# Patient Record
Sex: Female | Born: 1970 | Race: White | Hispanic: No | Marital: Single | Smoking: Current every day smoker
Health system: Southern US, Community
[De-identification: ages and names within clinical notes are randomized; demographics above are authoritative.]

## PROBLEM LIST (undated history)

## (undated) DIAGNOSIS — F419 Anxiety disorder, unspecified: Secondary | ICD-10-CM

## (undated) DIAGNOSIS — R51 Headache: Secondary | ICD-10-CM

## (undated) DIAGNOSIS — F410 Panic disorder [episodic paroxysmal anxiety] without agoraphobia: Secondary | ICD-10-CM

## (undated) DIAGNOSIS — N390 Urinary tract infection, site not specified: Secondary | ICD-10-CM

## (undated) DIAGNOSIS — N760 Acute vaginitis: Secondary | ICD-10-CM

## (undated) DIAGNOSIS — B9689 Other specified bacterial agents as the cause of diseases classified elsewhere: Secondary | ICD-10-CM

## (undated) DIAGNOSIS — A599 Trichomoniasis, unspecified: Secondary | ICD-10-CM

## (undated) DIAGNOSIS — F909 Attention-deficit hyperactivity disorder, unspecified type: Secondary | ICD-10-CM

## (undated) HISTORY — PX: BREAST ENHANCEMENT SURGERY: SHX7

## (undated) HISTORY — PX: ANKLE SURGERY: SHX546

## (undated) HISTORY — PX: TUBAL LIGATION: SHX77

---

## 2000-03-21 ENCOUNTER — Emergency Department (HOSPITAL_COMMUNITY): Admission: EM | Admit: 2000-03-21 | Discharge: 2000-03-21 | Payer: Self-pay | Admitting: Emergency Medicine

## 2000-03-25 ENCOUNTER — Emergency Department (HOSPITAL_COMMUNITY): Admission: EM | Admit: 2000-03-25 | Discharge: 2000-03-25 | Payer: Self-pay | Admitting: *Deleted

## 2000-03-26 ENCOUNTER — Encounter: Payer: Self-pay | Admitting: Emergency Medicine

## 2000-03-26 ENCOUNTER — Emergency Department (HOSPITAL_COMMUNITY): Admission: EM | Admit: 2000-03-26 | Discharge: 2000-03-26 | Payer: Self-pay | Admitting: Emergency Medicine

## 2004-03-09 ENCOUNTER — Inpatient Hospital Stay (HOSPITAL_COMMUNITY): Admission: EM | Admit: 2004-03-09 | Discharge: 2004-03-11 | Payer: Self-pay | Admitting: Emergency Medicine

## 2005-11-24 IMAGING — US US EXTREM LOW VENOUS*L*
1 series · 4 of 4 positions shown · non-contrast
Comparison: none

CLINICAL DATA: Left upper extremity cellulitis.  Question DVT.
 US VENOUS IMAGING, LEFT UPPER EXTREMITY
 Color, gray scale and Doppler imaging of the left upper extremity venous structures show no evidence of left upper extremity DVT.  The left brachial and basilic veins, left axillary vein, subclavian vein, left innominate vein, and left internal jugular vein are all widely patent.  
 IMPRESSION
 No evidence of left upper extremity DVT.

[Series 1: unknown · 0.07mm/px · 4 of 4 slices shown]
[im 1/4]
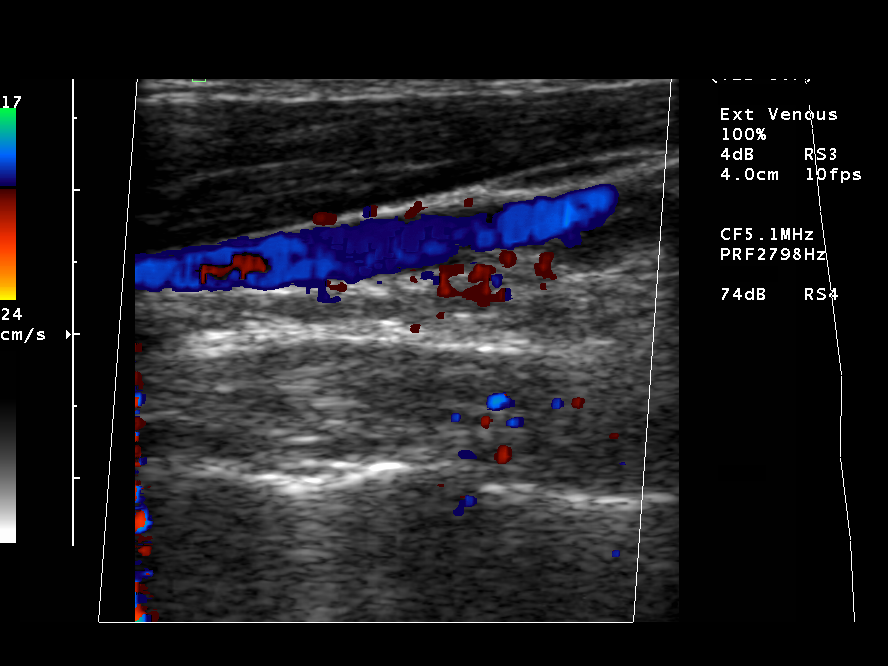
[im 2/4]
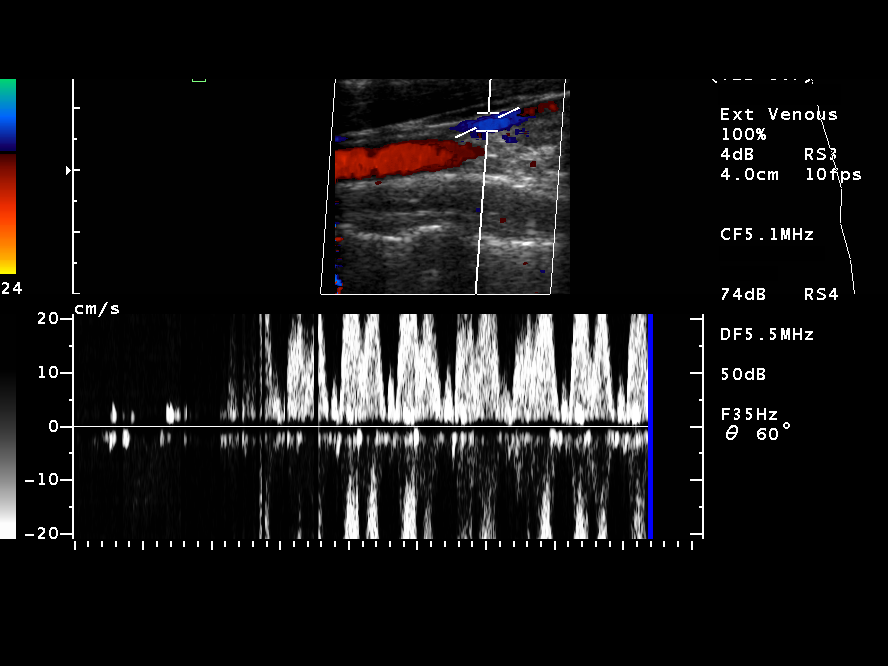
[im 3/4]
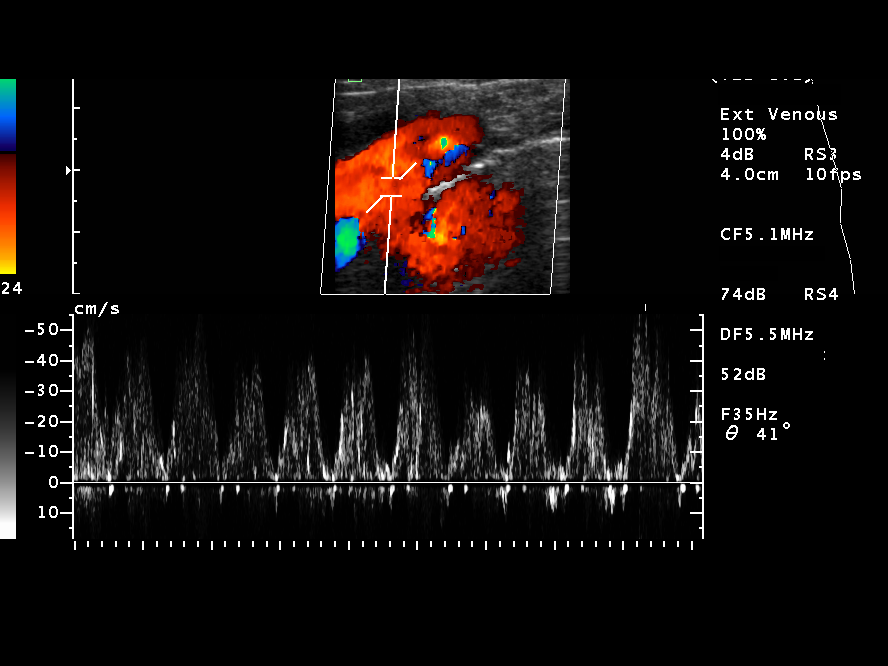
[im 4/4]
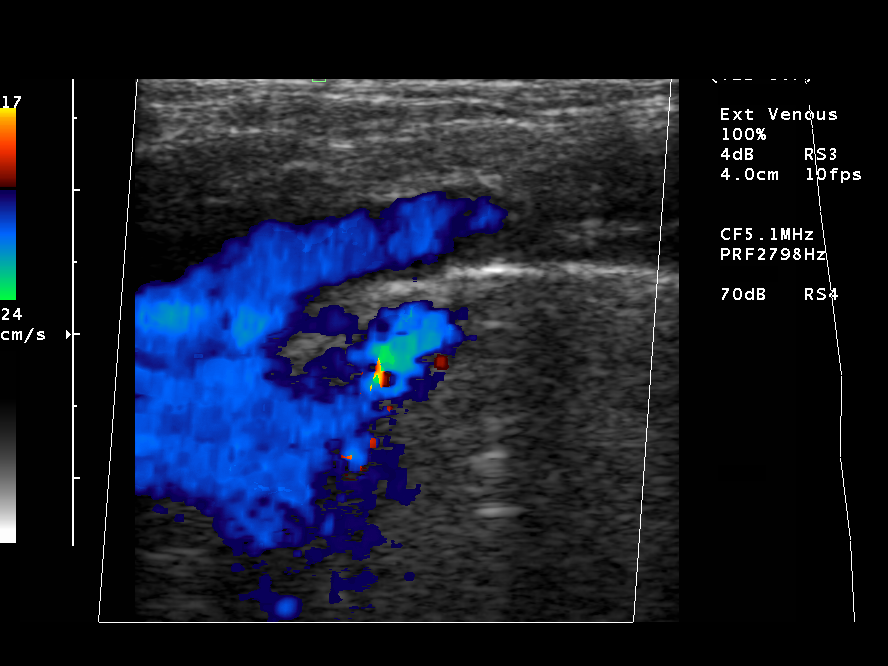

[4 of 4 positions shown; findings below may reference images not displayed]

## 2011-08-27 ENCOUNTER — Inpatient Hospital Stay (HOSPITAL_COMMUNITY)
Admission: AD | Admit: 2011-08-27 | Discharge: 2011-08-27 | Disposition: A | Discharge status: Home / Self Care | Payer: Self-pay | Source: Ambulatory Visit | Attending: Obstetrics & Gynecology | Admitting: Obstetrics & Gynecology

## 2011-08-27 ENCOUNTER — Inpatient Hospital Stay (HOSPITAL_COMMUNITY)
Admission: AD | Admit: 2011-08-27 | Discharge: 2011-08-27 | Discharge status: Against Medical Advice | Payer: Self-pay | Source: Ambulatory Visit | Attending: Obstetrics & Gynecology | Admitting: Obstetrics & Gynecology

## 2011-08-27 ENCOUNTER — Inpatient Hospital Stay (HOSPITAL_COMMUNITY): Payer: Self-pay

## 2011-08-27 ENCOUNTER — Encounter (HOSPITAL_COMMUNITY): Payer: Self-pay | Admitting: *Deleted

## 2011-08-27 DIAGNOSIS — N8003 Adenomyosis of the uterus: Secondary | ICD-10-CM

## 2011-08-27 DIAGNOSIS — N946 Dysmenorrhea, unspecified: Secondary | ICD-10-CM

## 2011-08-27 DIAGNOSIS — N921 Excessive and frequent menstruation with irregular cycle: Secondary | ICD-10-CM

## 2011-08-27 DIAGNOSIS — G43009 Migraine without aura, not intractable, without status migrainosus: Secondary | ICD-10-CM | POA: Insufficient documentation

## 2011-08-27 DIAGNOSIS — N938 Other specified abnormal uterine and vaginal bleeding: Secondary | ICD-10-CM | POA: Insufficient documentation

## 2011-08-27 DIAGNOSIS — R109 Unspecified abdominal pain: Secondary | ICD-10-CM | POA: Insufficient documentation

## 2011-08-27 DIAGNOSIS — N920 Excessive and frequent menstruation with regular cycle: Secondary | ICD-10-CM | POA: Insufficient documentation

## 2011-08-27 DIAGNOSIS — N8000 Endometriosis of the uterus, unspecified: Secondary | ICD-10-CM | POA: Insufficient documentation

## 2011-08-27 DIAGNOSIS — F419 Anxiety disorder, unspecified: Secondary | ICD-10-CM

## 2011-08-27 DIAGNOSIS — N949 Unspecified condition associated with female genital organs and menstrual cycle: Secondary | ICD-10-CM | POA: Insufficient documentation

## 2011-08-27 HISTORY — DX: Urinary tract infection, site not specified: N39.0

## 2011-08-27 HISTORY — DX: Panic disorder (episodic paroxysmal anxiety): F41.0

## 2011-08-27 HISTORY — DX: Acute vaginitis: N76.0

## 2011-08-27 HISTORY — DX: Anxiety disorder, unspecified: F41.9

## 2011-08-27 HISTORY — DX: Other specified bacterial agents as the cause of diseases classified elsewhere: B96.89

## 2011-08-27 HISTORY — DX: Trichomoniasis, unspecified: A59.9

## 2011-08-27 HISTORY — DX: Headache: R51

## 2011-08-27 HISTORY — DX: Attention-deficit hyperactivity disorder, unspecified type: F90.9

## 2011-08-27 LAB — URINALYSIS, ROUTINE W REFLEX MICROSCOPIC
Bilirubin Urine: NEGATIVE
Glucose, UA: NEGATIVE mg/dL
Ketones, ur: NEGATIVE mg/dL
Leukocytes, UA: NEGATIVE
Nitrite: NEGATIVE
Protein, ur: NEGATIVE mg/dL
Specific Gravity, Urine: 1.02 (ref 1.005–1.030)
Urobilinogen, UA: 0.2 mg/dL (ref 0.0–1.0)
pH: 6 (ref 5.0–8.0)

## 2011-08-27 LAB — CBC
HCT: 39.2 % (ref 36.0–46.0)
Hemoglobin: 13.6 g/dL (ref 12.0–15.0)
MCH: 33 pg (ref 26.0–34.0)
MCHC: 34.7 g/dL (ref 30.0–36.0)
MCV: 95.1 fL (ref 78.0–100.0)
Platelets: 232 10*3/uL (ref 150–400)
RBC: 4.12 MIL/uL (ref 3.87–5.11)
RDW: 13 % (ref 11.5–15.5)
WBC: 6 10*3/uL (ref 4.0–10.5)

## 2011-08-27 LAB — WET PREP, GENITAL
Clue Cells Wet Prep HPF POC: NONE SEEN
Trich, Wet Prep: NONE SEEN
Yeast Wet Prep HPF POC: NONE SEEN

## 2011-08-27 LAB — POCT PREGNANCY, URINE: Preg Test, Ur: NEGATIVE

## 2011-08-27 LAB — RAPID URINE DRUG SCREEN, HOSP PERFORMED
Amphetamines: POSITIVE — AB
Barbiturates: NOT DETECTED
Benzodiazepines: POSITIVE — AB

## 2011-08-27 MED ORDER — MEDROXYPROGESTERONE ACETATE 10 MG PO TABS: 10.0000 mg | ORAL_TABLET | Freq: Every day | ORAL | Status: DC

## 2011-08-27 MED ORDER — NAPROXEN SODIUM 550 MG PO TABS: 550.0000 mg | ORAL_TABLET | Freq: Two times a day (BID) | ORAL | Status: DC

## 2011-08-27 MED ORDER — METRONIDAZOLE 500 MG PO TABS: 500.0000 mg | ORAL_TABLET | Freq: Two times a day (BID) | ORAL | Status: AC

## 2011-08-27 MED ORDER — KETOROLAC TROMETHAMINE 60 MG/2ML IM SOLN
60.0000 mg | Freq: Once | INTRAMUSCULAR | Status: AC
  Administered 2011-08-27: 60 mg via INTRAMUSCULAR
  Filled 2011-08-27: qty 2

## 2011-08-27 MED ORDER — MELOXICAM 7.5 MG PO TABS: 7.5000 mg | ORAL_TABLET | Freq: Every day | ORAL | Status: AC

## 2011-08-27 MED ORDER — OXYCODONE-ACETAMINOPHEN 5-325 MG PO TABS
2.0000 | ORAL_TABLET | Freq: Once | ORAL | Status: AC
  Administered 2011-08-27: 2 via ORAL
  Filled 2011-08-27: qty 2

## 2011-08-27 MED ORDER — NAPROXEN 500 MG PO TABS
500.0000 mg | ORAL_TABLET | Freq: Once | ORAL | Status: AC
  Administered 2011-08-27: 500 mg via ORAL
  Filled 2011-08-27: qty 1

## 2011-08-27 MED ORDER — CYCLOBENZAPRINE HCL 10 MG PO TABS
10.0000 mg | ORAL_TABLET | Freq: Once | ORAL | Status: DC
  Filled 2011-08-27: qty 1
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
## 2011-08-27 NOTE — Progress Notes (Signed)
Patient states her last period was on 12-21 and lasted until the 29th and was the heaviest period she has ever had and had a lot of cramping. Bleeding and cramping started again 3 days ago. This morning had bleeding down her legs. Goes through 20 tampons a day. Has been having low back pain and a headache, her hair is falling out and no appetite. States she feels something firm coming out of the vagina.

## 2011-08-27 NOTE — Progress Notes (Signed)
Patient was preciously seen in MAU this morning and left AMA before her ultrasound. Patient states she is under a lot of stress and needs help with anxiety. States she is continuing to cramp and having some bleeding.
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
## 2011-08-27 NOTE — ED Notes (Signed)
Patient speech is slightly slurred. Has difficulty responding to simple commands like, "sit up," "lie down." Failed to mention things in medical history like chronic bladder infections, headaches, ADHD.  Would come up in conversation after the fact.

## 2011-08-27 NOTE — ED Notes (Signed)
When NP and RN went into room to do pelvic exam, patient states that she still has pain. Pain medication is not working. Patient tolerated exam without moving, tensing muscles or showing any other signs of discomfort. Only when asked, did patient C/O of pain.

## 2011-08-27 NOTE — Plan of Care (Signed)
Patient is not in the lobby when called to a room.  
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
## 2011-08-27 NOTE — ED Notes (Signed)
Patient and boyfriend thought they understood that adenomyosis was cancer. Boyfriend was thinking that she is going to die. Clarified at length that this is not cancerous, that no cancer was found today and that there is no reason to think that she has cancer based on information obtained today. Informed patient that her treatment at the clinic may include further testing that may include a biopsy of the uterus as part of the evaluation. Patient and boyfriend both verbalize understanding and are very relieved. Patient states she is satisfied with her care today and feels that her questions and concerns were addressed.
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
## 2011-08-28 LAB — GC/CHLAMYDIA PROBE AMP, GENITAL: Chlamydia, DNA Probe: NEGATIVE

## 2011-08-31 ENCOUNTER — Encounter: Payer: Self-pay | Admitting: Obstetrics & Gynecology

## 2011-09-20 ENCOUNTER — Ambulatory Visit (INDEPENDENT_AMBULATORY_CARE_PROVIDER_SITE_OTHER): Payer: Self-pay | Admitting: Advanced Practice Midwife

## 2011-09-20 ENCOUNTER — Encounter: Payer: Self-pay | Admitting: Advanced Practice Midwife

## 2011-09-20 ENCOUNTER — Other Ambulatory Visit (HOSPITAL_COMMUNITY)
Admission: RE | Admit: 2011-09-20 | Discharge: 2011-09-20 | Disposition: A | Discharge status: Home / Self Care | Payer: Self-pay | Source: Ambulatory Visit | Attending: Advanced Practice Midwife | Admitting: Advanced Practice Midwife

## 2011-09-20 DIAGNOSIS — N949 Unspecified condition associated with female genital organs and menstrual cycle: Secondary | ICD-10-CM | POA: Insufficient documentation

## 2011-09-20 DIAGNOSIS — N939 Abnormal uterine and vaginal bleeding, unspecified: Secondary | ICD-10-CM

## 2011-09-20 DIAGNOSIS — Z01812 Encounter for preprocedural laboratory examination: Secondary | ICD-10-CM

## 2011-09-20 DIAGNOSIS — N800 Endometriosis of uterus: Secondary | ICD-10-CM

## 2011-09-20 DIAGNOSIS — N938 Other specified abnormal uterine and vaginal bleeding: Secondary | ICD-10-CM | POA: Insufficient documentation

## 2011-09-20 DIAGNOSIS — F411 Generalized anxiety disorder: Secondary | ICD-10-CM

## 2011-09-20 DIAGNOSIS — F419 Anxiety disorder, unspecified: Secondary | ICD-10-CM

## 2011-09-20 DIAGNOSIS — F410 Panic disorder [episodic paroxysmal anxiety] without agoraphobia: Secondary | ICD-10-CM

## 2011-09-20 DIAGNOSIS — R1032 Left lower quadrant pain: Secondary | ICD-10-CM

## 2011-09-20 LAB — POCT PREGNANCY, URINE: Preg Test, Ur: NEGATIVE

## 2011-09-20 MED ORDER — OXYCODONE-ACETAMINOPHEN 5-325 MG PO TABS: 1.0000 | ORAL_TABLET | ORAL | Status: AC | PRN

## 2011-09-20 NOTE — Patient Instructions (Signed)
Abnormal Uterine Bleeding Abnormal uterine bleeding can have many causes. Some cases are simply treated, while others are more serious. There are several kinds of bleeding that is considered abnormal, including:  Bleeding between periods.     Bleeding after sexual intercourse.     Spotting anytime in the menstrual cycle.     Bleeding heavier or more than normal.     Bleeding after menopause.  CAUSES   There are many causes of abnormal uterine bleeding. It can be present in teenagers, pregnant women, women during their reproductive years, and women who have reached menopause. Your caregiver will look for the more common causes depending on your age, signs, symptoms and your particular circumstance. Most cases are not serious and can be treated. Even the more serious causes, like cancer of the female organs, can be treated adequately if found in the early stages. That is why all types of bleeding should be evaluated and treated as soon as possible. DIAGNOSIS   Diagnosing the cause may take several kinds of tests. Your caregiver may:  Take a complete history of the type of bleeding.     Perform a complete physical exam and Pap smear.     Take an ultrasound on the abdomen showing a picture of the female organs and the pelvis.     Inject dye into the uterus and Fallopian tubes and X-ray them (hysterosalpingogram).     Place fluid in the uterus and do an ultrasound (sonohysterogrqphy).     Take a CT scan to examine the female organs and pelvis.     Take an MRI to examine the female organs and pelvis. There is no X-ray involved with this procedure.     Look inside the uterus with a telescope that has a light at the end (hysteroscopy).     Scrap the inside of the uterus to get tissue to examine (Dilatation and Curettage, D&C).     Look into the pelvis with a telescope that has a light at the end (laparoscopy). This is done through a very small cut (incision) in the abdomen.  TREATMENT     Treatment will depend on the cause of the abnormal bleeding. It can include:  Doing nothing to allow the problem to take care of itself over time.     Hormone treatment.     Birth control pills.     Treating the medical condition causing the problem.     Laparoscopy.    Major or minor surgery     Destroying the lining of the uterus with electrical currant, laser, freezing or heat (uterine ablation).  HOME CARE INSTRUCTIONS    Follow your caregiver's recommendation on how to treat your problem.     See your caregiver if you missed a menstrual period and think you may be pregnant.     If you are bleeding heavily, count the number of pads/tampons you use and how often you have to change them. Tell this to your caregiver.     Avoid sexual intercourse until the problem is controlled.  SEEK MEDICAL CARE IF:    You have any kind of abnormal bleeding mentioned above.     You feel dizzy at times.     You are 41 years old and have not had a menstrual period yet.  SEEK IMMEDIATE MEDICAL CARE IF:    You pass out.     You are changing pads/tampons every 15 to 30 minutes.     You have belly (abdominal)   pain.     You have a temperature of 100 F (37.8 C) or higher.     You become sweaty or weak.     You are passing large blood clots from the vagina.     You start to feel sick to your stomach (nauseous) and throw up (vomit).  Document Released: 07/26/2005 Document Revised: 04/07/2011 Document Reviewed: 12/19/2008 [employer] Patient Information 2012 [employer].Endometrial Biopsy This is a test in which a tissue sample (a biopsy) is taken from inside the uterus (womb). It is then looked at by a specialist under a microscope to see if the tissue is normal or abnormal. The endometrium is the lining of the uterus. This test helps determine where you are in your menstrual cycle and how hormone levels are affecting the lining of the uterus. Another use for this test is to diagnose  endometrial cancer, tuberculosis, polyps, or inflammatory conditions and to evaluate uterine bleeding. PREPARATION FOR TEST No preparation or fasting is necessary. NORMAL FINDINGS No pathologic conditions. Presence of "secretory-type" endometrium 3 to 5 days before to normal menstruation. Ranges for normal findings may vary among different laboratories and hospitals. You should always check with your doctor after having lab work or other tests done to discuss the meaning of your test results and whether your values are considered within normal limits. MEANING OF TEST   Your caregiver will go over the test results with you and discuss the importance and meaning of your results, as well as treatment options and the need for additional tests if necessary. OBTAINING THE TEST RESULTS It is your responsibility to obtain your test results. Ask the lab or department performing the test when and how you will get your results. Document Released: 11/26/2004 Document Revised: 04/07/2011 Document Reviewed: 07/05/2008 [employer] Patient Information 2012 [employer]. 

## 2011-09-20 NOTE — Progress Notes (Signed)
  Subjective:    Patient ID: Christy Osborne, female    DOB: 02/21/1971, 41 y.o.   MRN: [id number]  HPI F/U visit from [facility] for DUB and dysmenorrhea. [place] suggestive of adenomyosis. New onset of LLQ pain a few days ago. Constant, sharp, denies constipation, diarrhea, fever, chills. Ibuprofen, Mobic not working. Took friend's percocet w/ good relief. Not currently bleeding.    Review of Systems: Otherwise neg Endometrial Biopsy Procedure Note  Pre-operative Diagnosis: DUB  Post-operative Diagnosis: same  Indications: abnormal uterine bleeding  Procedure Details   Urine pregnancy test was done today and result was negative.  The risks (including infection, bleeding, pain, and uterine perforation) and benefits of the procedure were explained to the patient and Written informed consent was obtained.  Antibiotic prophylaxis against endocarditis was not indicated.   The patient was placed in the dorsal lithotomy position.  Bimanual exam showed the uterus to be in the anteroflexed position.  A Graves' speculum inserted in the vagina, and the cervix prepped with povidone iodine.  Endocervical curettage with a Kevorkian curette was not performed.   A sharp tenaculum was not needed. A sterile uterine sound was used to sound the uterus to a depth of 8cm.  A Pipelle endometrial aspirator was used to sample the endometrium.  Sample was sent for pathologic examination.  Condition: Stable  Complications: None  Plan:  The patient was advised to call for any fever or for prolonged or severe pain or bleeding. She was advised to use Percocet as needed for mild to moderate pain. She was advised to avoid vaginal intercourse for 48 hours or until the bleeding has completely stopped.  Attending Physician Documentation: I was present for or participated in the entire procedure, including opening and closing.     Objective:   Physical Exam  Constitutional: She is oriented to person, place, and time. She  appears well-developed and well-nourished. She appears distressed (mildly).  Cardiovascular: Normal rate.   Pulmonary/Chest: Effort normal.  Abdominal: Soft. Bowel sounds are normal. There is tenderness (mod tenderness in LLQ ) in the left lower quadrant. There is no CVA tenderness.    Genitourinary: Vagina normal. Uterus is enlarged and tender (mildly). Cervix exhibits no motion tenderness, no discharge and no friability. Right adnexum displays no mass, no tenderness and no fullness. Left adnexum displays tenderness (mild). Left adnexum displays no mass and no fullness.  Neurological: She is alert and oriented to person, place, and time.  Skin: Skin is warm and dry.  Psychiatric: Her mood appears anxious. Her affect is labile.   UTP neg     Assessment & Plan:  Assessment: 1. DUB 2. Adenomyosis 3. LLQ pain of unknown etiology. Unlikely Gyn  Plan: 1. Transvaginal US 2. Percocet 5-325 #10 3. F/U in 4 weeks for results 4. ED PRN for worsening Sx.  Dorathy Kinsman 09/20/2011 3:41 PM

## 2011-09-21 LAB — DRUG SCREEN, URINE, NO CONFIRMATION
Amphetamine Screen, Ur: NEGATIVE
Barbiturate Quant, Ur: NEGATIVE
Creatinine,U: 30.1 mg/dL
Methadone: NEGATIVE

## 2011-09-22 ENCOUNTER — Encounter: Payer: Self-pay | Admitting: Advanced Practice Midwife

## 2011-09-22 DIAGNOSIS — F419 Anxiety disorder, unspecified: Secondary | ICD-10-CM | POA: Insufficient documentation

## 2011-09-22 DIAGNOSIS — N800 Endometriosis of uterus: Secondary | ICD-10-CM | POA: Insufficient documentation

## 2011-09-22 DIAGNOSIS — F410 Panic disorder [episodic paroxysmal anxiety] without agoraphobia: Secondary | ICD-10-CM | POA: Insufficient documentation

## 2011-09-22 DIAGNOSIS — R1032 Left lower quadrant pain: Secondary | ICD-10-CM | POA: Insufficient documentation

## 2011-09-27 ENCOUNTER — Ambulatory Visit (HOSPITAL_COMMUNITY): Payer: Self-pay

## 2011-09-27 ENCOUNTER — Telehealth: Payer: Self-pay | Admitting: Obstetrics and Gynecology

## 2011-09-27 NOTE — Telephone Encounter (Signed)
Patient called for test results. Advised that she have an appt specifically to discuss results of endometrial biopsy and U/S transvaginal on 10/18/11 @1 :30pm. Patient realized that she missed her appt today for U/S TransVAG. Re-scheduled U/S appt to 10/14/11 at 9:15AM. Patient agreed to all.

## 2011-10-14 ENCOUNTER — Ambulatory Visit (HOSPITAL_COMMUNITY): Admission: RE | Admit: 2011-10-14 | Payer: Self-pay | Source: Ambulatory Visit

## 2011-10-18 ENCOUNTER — Ambulatory Visit: Payer: Self-pay | Admitting: Obstetrics and Gynecology

## 2012-01-06 ENCOUNTER — Telehealth: Payer: Self-pay | Admitting: *Deleted

## 2012-01-06 MED ORDER — METRONIDAZOLE 500 MG PO TABS: 500.0000 mg | ORAL_TABLET | Freq: Two times a day (BID) | ORAL | Status: AC

## 2012-01-06 NOTE — Telephone Encounter (Signed)
Pt left message requesting Flagyl for sx of BV.  I returned pt's call this morning and she stated that she is having a vag d/c w/odor and no itching. She has been treated for BV in the past. I told pt that I will send in her Rx (per standing protocol) and verified her pharmacy. Pt had no further questions and voiced understanding.

## 2012-03-23 ENCOUNTER — Telehealth: Payer: Self-pay | Admitting: *Deleted

## 2012-03-23 NOTE — Telephone Encounter (Signed)
Called patient and left a voicemail that we are returning her call. Per Gyn protocols patient will need an appt to be seen as we have called her in medicine within the past 6 months.

## 2012-03-23 NOTE — Telephone Encounter (Signed)
Patient left a message stating that she thinks she has bv and would like [place] to call her in flagyl.

## 2012-03-27 ENCOUNTER — Telehealth: Payer: Self-pay | Admitting: General Practice

## 2012-03-27 NOTE — Telephone Encounter (Signed)
Returned patient call. LM for patient to return call to clinic. See Amanda's previous note, will need to be seen to get refill on Flagyl as it has already been called in within the last 6 months, per protocol.

## 2012-03-27 NOTE — Telephone Encounter (Signed)
Patient called stating she was returning a phone call she received from a nurse this morning and would like a call back

## 2012-03-27 NOTE — Telephone Encounter (Signed)
Duplicate encounter. See previous tel enc.

## 2012-03-27 NOTE — Telephone Encounter (Signed)
Pt informed that she needs to make an appt to be evaluated. I offered to transfer her to the desk to make an appt pt declined and said that she will just call back.

## 2012-08-23 ENCOUNTER — Telehealth: Payer: Self-pay | Admitting: Obstetrics and Gynecology

## 2012-08-23 ENCOUNTER — Telehealth: Payer: Self-pay | Admitting: *Deleted

## 2012-08-23 DIAGNOSIS — B9689 Other specified bacterial agents as the cause of diseases classified elsewhere: Secondary | ICD-10-CM

## 2012-08-23 MED ORDER — METRONIDAZOLE 500 MG PO TABS: 500.0000 mg | ORAL_TABLET | Freq: Two times a day (BID) | ORAL | Status: DC

## 2012-08-23 NOTE — Telephone Encounter (Signed)
Called pt and pt picked up as I was verifing her DOB/last 4 digits pt did a deep sigh and became quiet.  I called out to pt x 4 and she did not respond so I hung up.  Called pt again and it went straight to voicemail and I left message that we are returning her call and if she has any questions to please give [place] a call back.

## 2012-08-23 NOTE — Telephone Encounter (Signed)
Patient called with c/o white thin vaginal discharge with "fishy" odor. States she's had one like it before and she is requesting for flagyl. Flagyl 500 mg per protocol sent to pt's pharmacy. Patient satisfied.

## 2012-08-23 NOTE — Telephone Encounter (Signed)
Opened in error

## 2012-08-23 NOTE — Telephone Encounter (Signed)
Pt left message requesting a prescription refill- did not state name of medication.

## 2013-05-13 IMAGING — US US PELVIS COMPLETE
1 series · 13 of 25 positions shown · non-contrast
Comparison: None.

CLINICAL DATA: Pain with vaginal bleeding and cramping, severe
headache. Question IUD (IUD a long time ago, never removed).  LMP
07/30/2011

TRANSABDOMINAL AND TRANSVAGINAL ULTRASOUND OF PELVIS
TECHNIQUE: Both transabdominal and transvaginal ultrasound
examinations of the pelvis were performed. Transabdominal technique
was performed for global imaging of the pelvis including uterus,
ovaries, adnexal regions, and pelvic cul-de-sac.

[Series 1: us pelvis complete · 13 of 49 slices shown]
[im 1/49]
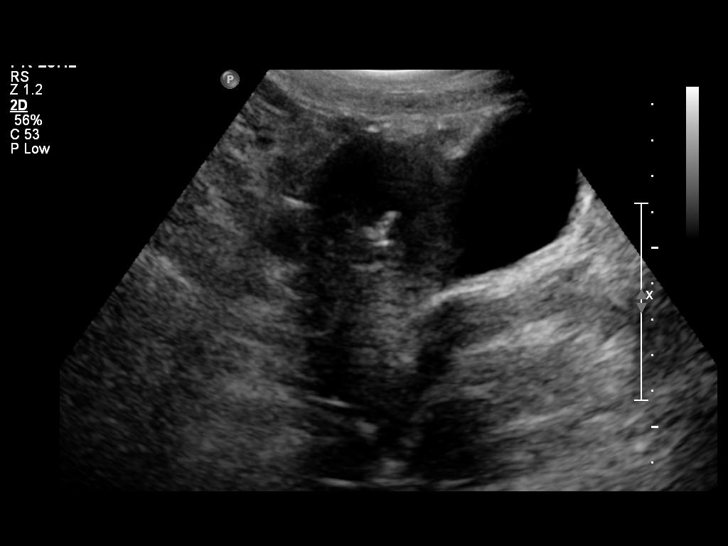
[im 5/49]
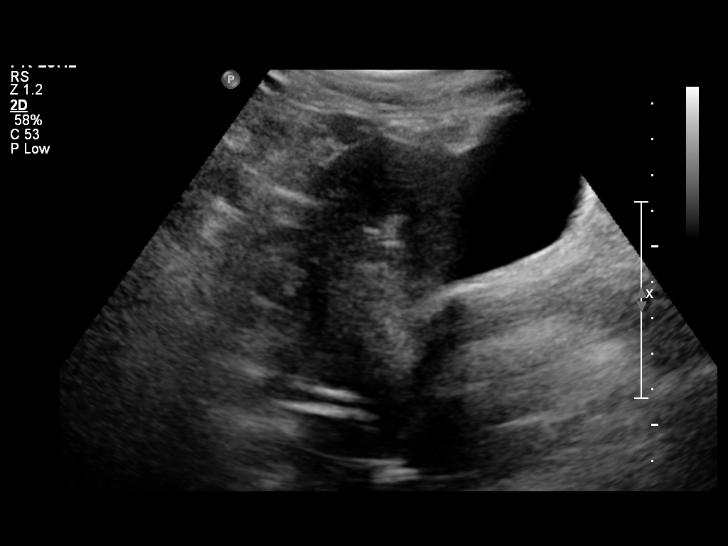
[im 9/49]
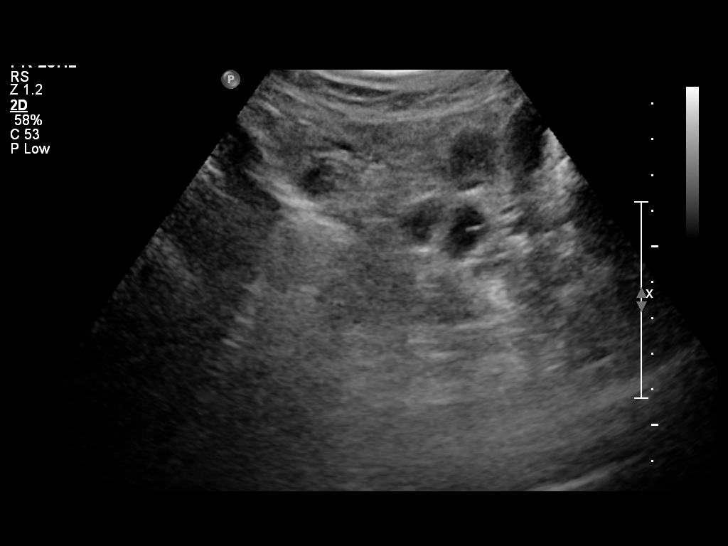
[im 13/49]
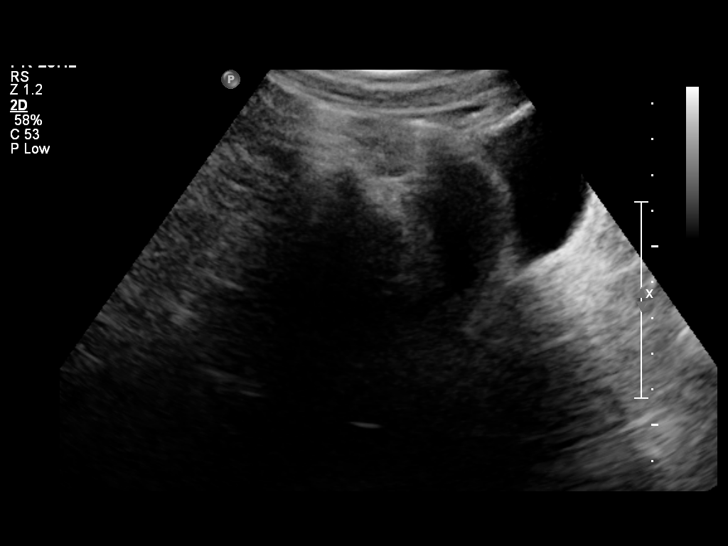
[im 17/49]
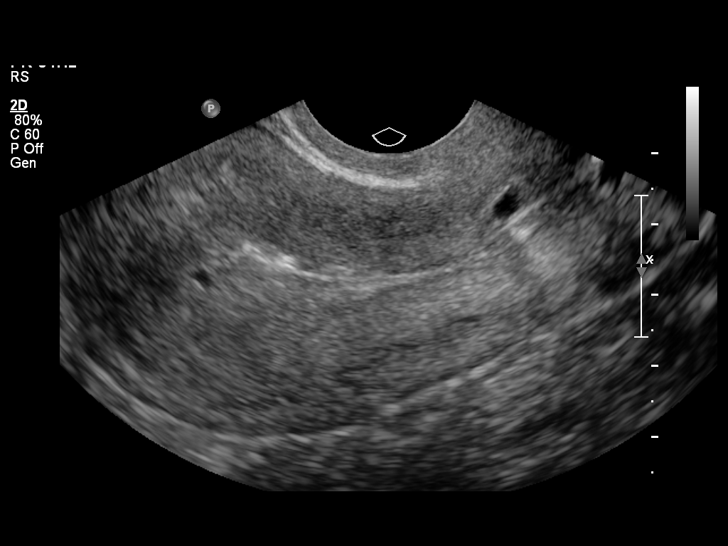
[im 21/49]
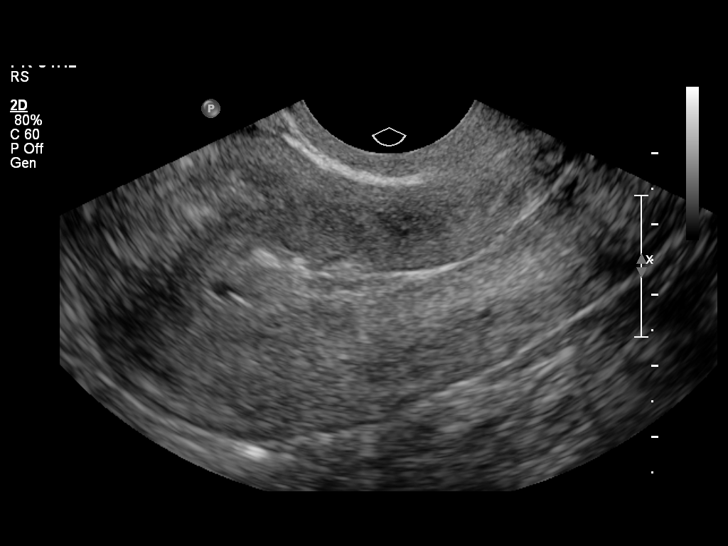
[im 25/49]
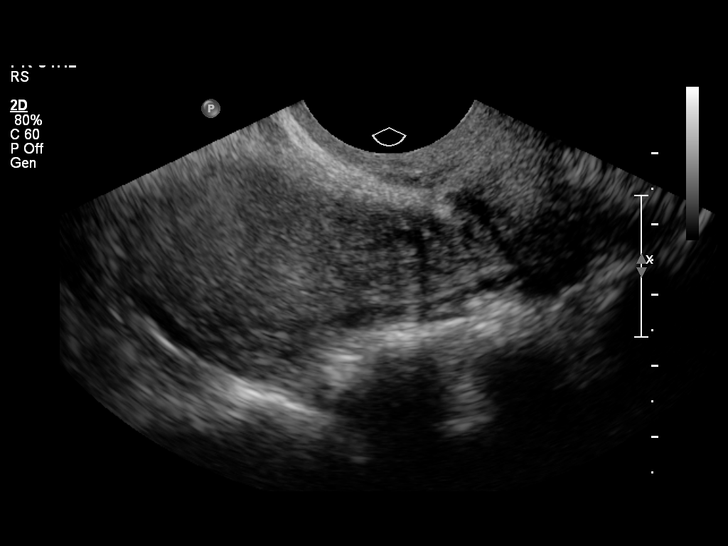
[im 29/49]
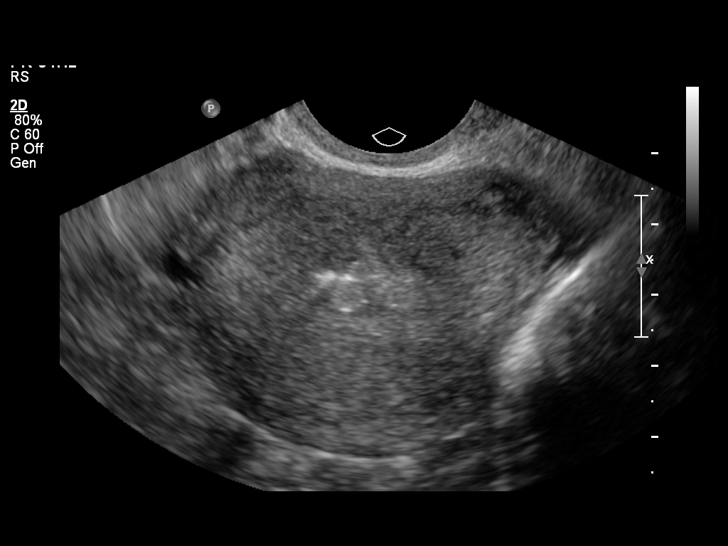
[im 33/49]
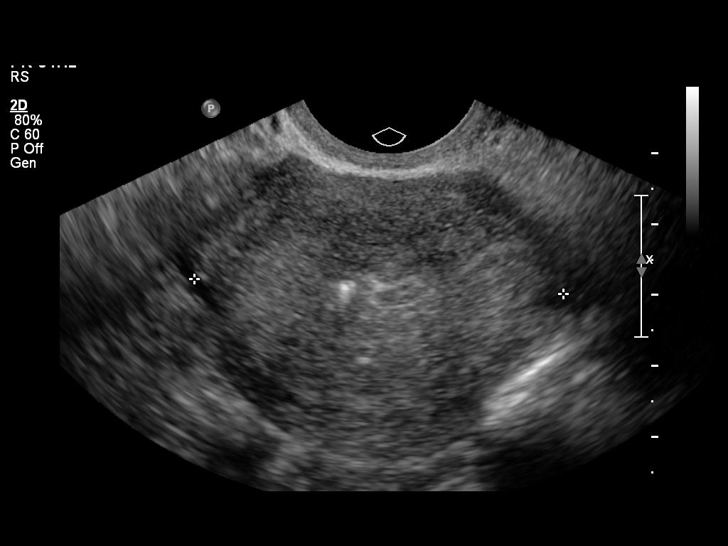
[im 37/49]
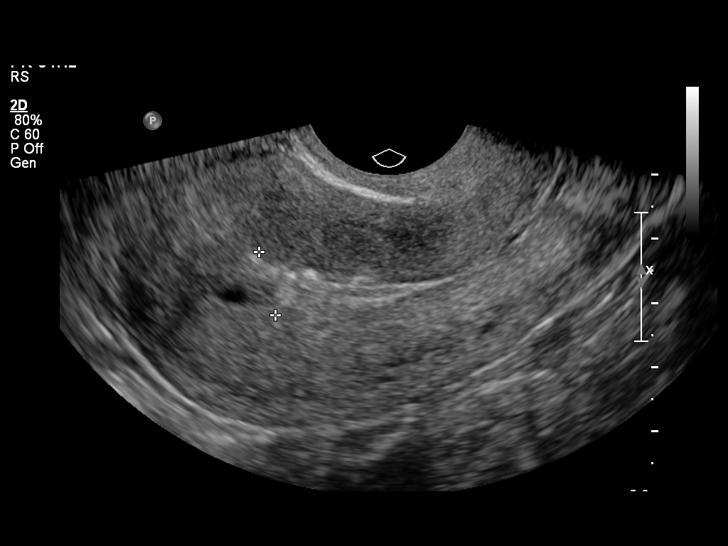
[im 41/49]
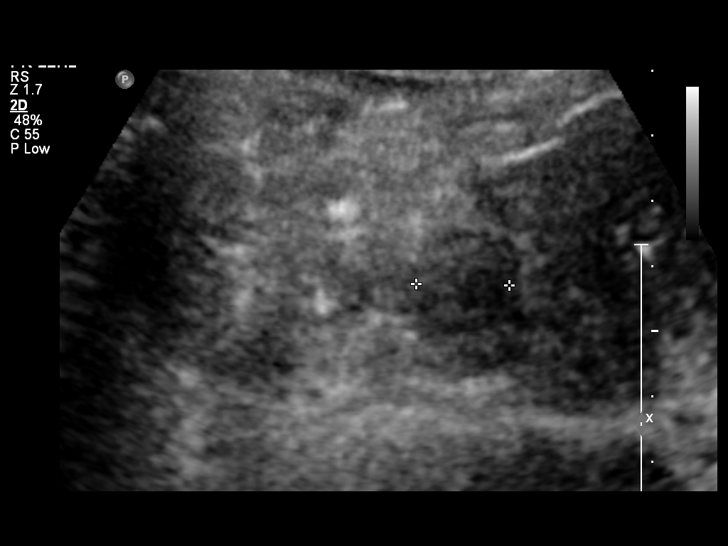
[im 45/49]
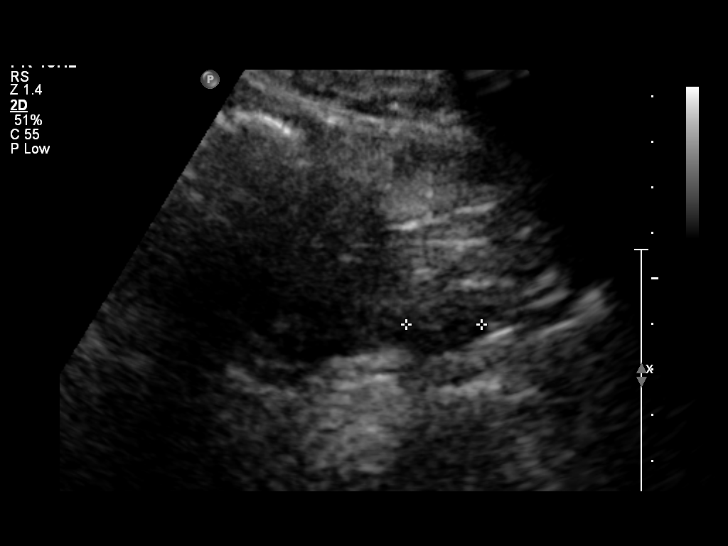
[im 49/49]
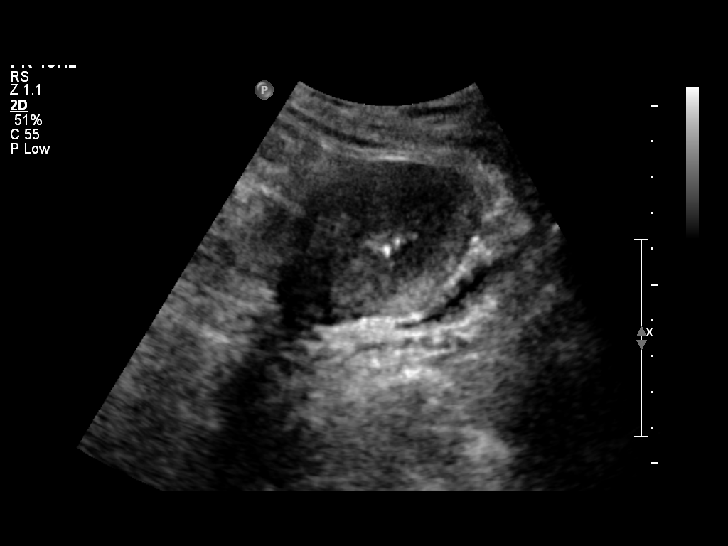

[13 of 25 positions shown; findings below may reference images not displayed]

It was necessary to proceed with endovaginal exam following the
transabdominal exam to visualize the myometrium, endometrium and
adnexa.
FINDINGS: Uterus: Demonstrates a sagittal length of 8.8 cm, depth of 4.3 cm
and width of 5.2 cm.  A subcentimeter subendometrial mural cyst is
seen in the posterior fundal region and raises the possibility of
adenomyosis

Endometrium: Echogenic foci are identified around the periphery of
the endometrial lining suggestive of dystrophic calcifications or
mucous within subendometrial glands.  No definite IUD is
identified. The endometrial myometrial interface is poorly
visualized making accurate endometrial measurement difficult.  Poor
visibility of this interface is also suspicious for underlying
adenomyosis.  A well-defined transitional zone is not seen.

Right ovary:  Was only visualized transabdominally due to the
patient's inability to cooperate with the adnexal exam
endovaginally.  This has a normal transabdominal appearance
measuring 1.4 x 1.9 x 1.7 cm

Left ovary: Was only visualized transabdominally due to difficult
adnexal evaluation on endovaginal exam.  This has a normal
transabdominal appearance measuring 1.6 x 1.9 x 1.4 cm.

Other findings: No pelvic fluid or separate adnexal masses are
seen.
IMPRESSION: Small mural cyst and poor endometrial myometrial interface suggest
the possibility of adenomyosis.

Subendometrial echogenic foci compatible with dystrophic
calcification or mucous within subendometrial glands.  This finding
requires no further follow-up.

Normal transabdominal appearance to the ovaries..

## 2013-06-19 ENCOUNTER — Telehealth: Payer: Self-pay | Admitting: *Deleted

## 2013-06-19 DIAGNOSIS — B9689 Other specified bacterial agents as the cause of diseases classified elsewhere: Secondary | ICD-10-CM

## 2013-06-19 MED ORDER — METRONIDAZOLE 500 MG PO TABS: 500.0000 mg | ORAL_TABLET | Freq: Two times a day (BID) | ORAL | Status: DC

## 2013-06-19 NOTE — Telephone Encounter (Signed)
Pt called nurse line for prescription for Flagyl.  Spoke with patient regarding prescription.  Reordered, informed pt of test results from 09/2012 and need for another yearly appt.

## 2014-05-10 ENCOUNTER — Inpatient Hospital Stay (HOSPITAL_COMMUNITY)
Admission: EM | Admit: 2014-05-10 | Discharge: 2014-05-13 | DRG: 689 | Disposition: A | Discharge status: Home / Self Care | Payer: Self-pay | Attending: Internal Medicine | Admitting: Internal Medicine

## 2014-05-10 ENCOUNTER — Encounter (HOSPITAL_COMMUNITY): Payer: Self-pay | Admitting: Emergency Medicine

## 2014-05-10 ENCOUNTER — Emergency Department (HOSPITAL_COMMUNITY): Payer: BC Managed Care – PPO

## 2014-05-10 DIAGNOSIS — A419 Sepsis, unspecified organism: Secondary | ICD-10-CM | POA: Insufficient documentation

## 2014-05-10 DIAGNOSIS — E43 Unspecified severe protein-calorie malnutrition: Secondary | ICD-10-CM | POA: Insufficient documentation

## 2014-05-10 DIAGNOSIS — R112 Nausea with vomiting, unspecified: Secondary | ICD-10-CM | POA: Diagnosis present

## 2014-05-10 DIAGNOSIS — F17210 Nicotine dependence, cigarettes, uncomplicated: Secondary | ICD-10-CM | POA: Diagnosis present

## 2014-05-10 DIAGNOSIS — N8000 Endometriosis of the uterus, unspecified: Secondary | ICD-10-CM | POA: Diagnosis present

## 2014-05-10 DIAGNOSIS — N12 Tubulo-interstitial nephritis, not specified as acute or chronic: Secondary | ICD-10-CM | POA: Diagnosis present

## 2014-05-10 DIAGNOSIS — F1110 Opioid abuse, uncomplicated: Secondary | ICD-10-CM | POA: Diagnosis present

## 2014-05-10 DIAGNOSIS — N938 Other specified abnormal uterine and vaginal bleeding: Secondary | ICD-10-CM | POA: Diagnosis present

## 2014-05-10 DIAGNOSIS — Z79899 Other long term (current) drug therapy: Secondary | ICD-10-CM

## 2014-05-10 DIAGNOSIS — R109 Unspecified abdominal pain: Secondary | ICD-10-CM | POA: Diagnosis present

## 2014-05-10 DIAGNOSIS — I9589 Other hypotension: Secondary | ICD-10-CM

## 2014-05-10 DIAGNOSIS — F410 Panic disorder [episodic paroxysmal anxiety] without agoraphobia: Secondary | ICD-10-CM | POA: Diagnosis present

## 2014-05-10 DIAGNOSIS — F419 Anxiety disorder, unspecified: Secondary | ICD-10-CM | POA: Diagnosis present

## 2014-05-10 DIAGNOSIS — IMO0002 Reserved for concepts with insufficient information to code with codable children: Secondary | ICD-10-CM

## 2014-05-10 DIAGNOSIS — R1030 Lower abdominal pain, unspecified: Secondary | ICD-10-CM

## 2014-05-10 DIAGNOSIS — N8003 Adenomyosis of the uterus: Secondary | ICD-10-CM | POA: Diagnosis present

## 2014-05-10 DIAGNOSIS — N10 Acute tubulo-interstitial nephritis: Principal | ICD-10-CM | POA: Diagnosis present

## 2014-05-10 DIAGNOSIS — E871 Hypo-osmolality and hyponatremia: Secondary | ICD-10-CM | POA: Diagnosis present

## 2014-05-10 DIAGNOSIS — E876 Hypokalemia: Secondary | ICD-10-CM | POA: Diagnosis present

## 2014-05-10 DIAGNOSIS — K2970 Gastritis, unspecified, without bleeding: Secondary | ICD-10-CM | POA: Diagnosis present

## 2014-05-10 DIAGNOSIS — R197 Diarrhea, unspecified: Secondary | ICD-10-CM | POA: Diagnosis present

## 2014-05-10 DIAGNOSIS — Z91410 Personal history of adult physical and sexual abuse: Secondary | ICD-10-CM

## 2014-05-10 DIAGNOSIS — F909 Attention-deficit hyperactivity disorder, unspecified type: Secondary | ICD-10-CM | POA: Diagnosis present

## 2014-05-10 DIAGNOSIS — E861 Hypovolemia: Secondary | ICD-10-CM | POA: Diagnosis present

## 2014-05-10 LAB — COMPREHENSIVE METABOLIC PANEL
ALK PHOS: 89 U/L (ref 39–117)
ALT: 21 U/L (ref 0–35)
AST: 24 U/L (ref 0–37)
Albumin: 3 g/dL — ABNORMAL LOW (ref 3.5–5.2)
Anion gap: 14 (ref 5–15)
BILIRUBIN TOTAL: 0.7 mg/dL (ref 0.3–1.2)
BUN: 11 mg/dL (ref 6–23)
CALCIUM: 8.7 mg/dL (ref 8.4–10.5)
CHLORIDE: 90 meq/L — AB (ref 96–112)
CO2: 22 meq/L (ref 19–32)
Creatinine, Ser: 0.93 mg/dL (ref 0.50–1.10)
GFR, EST AFRICAN AMERICAN: 88 mL/min — AB (ref >90)
GFR, EST NON AFRICAN AMERICAN: 76 mL/min — AB (ref >90)
GLUCOSE: 104 mg/dL — AB (ref 70–99)
POTASSIUM: 3.4 meq/L — AB (ref 3.7–5.3)
SODIUM: 126 meq/L — AB (ref 137–147)
Total Protein: 7.5 g/dL (ref 6.0–8.3)

## 2014-05-10 LAB — BASIC METABOLIC PANEL
ANION GAP: 12 (ref 5–15)
BUN: 10 mg/dL (ref 6–23)
CHLORIDE: 96 meq/L (ref 96–112)
CO2: 20 mEq/L (ref 19–32)
Calcium: 7.7 mg/dL — ABNORMAL LOW (ref 8.4–10.5)
Creatinine, Ser: 0.69 mg/dL (ref 0.50–1.10)
GFR calc non Af Amer: >90 mL/min (ref >90)
Glucose, Bld: 95 mg/dL (ref 70–99)
Potassium: 3.8 mEq/L (ref 3.7–5.3)
SODIUM: 128 meq/L — AB (ref 137–147)

## 2014-05-10 LAB — URINALYSIS, ROUTINE W REFLEX MICROSCOPIC
BILIRUBIN URINE: NEGATIVE
Glucose, UA: NEGATIVE mg/dL
KETONES UR: NEGATIVE mg/dL
Nitrite: NEGATIVE
PROTEIN: NEGATIVE mg/dL
Specific Gravity, Urine: 1.006 (ref 1.005–1.030)
Urobilinogen, UA: 1 mg/dL (ref 0.0–1.0)
pH: 6 (ref 5.0–8.0)

## 2014-05-10 LAB — CBC WITH DIFFERENTIAL/PLATELET
Basophils Absolute: 0.2 10*3/uL — ABNORMAL HIGH (ref 0.0–0.1)
Basophils Relative: 1 % (ref 0–1)
EOS PCT: 0 % (ref 0–5)
Eosinophils Absolute: 0 10*3/uL (ref 0.0–0.7)
HCT: 35.2 % — ABNORMAL LOW (ref 36.0–46.0)
HEMOGLOBIN: 12.1 g/dL (ref 12.0–15.0)
Lymphocytes Relative: 14 % (ref 12–46)
Lymphs Abs: 2.4 10*3/uL (ref 0.7–4.0)
MCH: 31.3 pg (ref 26.0–34.0)
MCHC: 34.4 g/dL (ref 30.0–36.0)
MCV: 91 fL (ref 78.0–100.0)
MONOS PCT: 16 % — AB (ref 3–12)
Monocytes Absolute: 2.7 10*3/uL — ABNORMAL HIGH (ref 0.1–1.0)
NEUTROS PCT: 69 % (ref 43–77)
Neutro Abs: 11.6 10*3/uL — ABNORMAL HIGH (ref 1.7–7.7)
Platelets: 213 10*3/uL (ref 150–400)
RBC: 3.87 MIL/uL (ref 3.87–5.11)
RDW: 13.4 % (ref 11.5–15.5)
WBC: 16.9 10*3/uL — ABNORMAL HIGH (ref 4.0–10.5)

## 2014-05-10 LAB — HIV ANTIBODY (ROUTINE TESTING W REFLEX): HIV: NONREACTIVE

## 2014-05-10 LAB — RAPID URINE DRUG SCREEN, HOSP PERFORMED
Amphetamines: NOT DETECTED
BARBITURATES: NOT DETECTED
BENZODIAZEPINES: POSITIVE — AB
COCAINE: NOT DETECTED
Opiates: POSITIVE — AB
Tetrahydrocannabinol: POSITIVE — AB

## 2014-05-10 LAB — URINE MICROSCOPIC-ADD ON

## 2014-05-10 LAB — LIPASE, BLOOD: Lipase: 35 U/L (ref 11–59)

## 2014-05-10 LAB — WET PREP, GENITAL
CLUE CELLS WET PREP: NONE SEEN
Trich, Wet Prep: NONE SEEN
Yeast Wet Prep HPF POC: NONE SEEN

## 2014-05-10 LAB — PREGNANCY, URINE: Preg Test, Ur: NEGATIVE

## 2014-05-10 MED ORDER — ENOXAPARIN SODIUM 40 MG/0.4ML ~~LOC~~ SOLN
40.0000 mg | SUBCUTANEOUS | Status: DC
  Administered 2014-05-10 – 2014-05-12 (×3): 40 mg via SUBCUTANEOUS
  Filled 2014-05-10 (×4): qty 0.4

## 2014-05-10 MED ORDER — ACETAMINOPHEN 650 MG RE SUPP
650.0000 mg | Freq: Four times a day (QID) | RECTAL | Status: DC | PRN
Start: 2014-05-10 — End: 2014-05-13

## 2014-05-10 MED ORDER — SODIUM CHLORIDE 0.9 % IV BOLUS (SEPSIS)
1000.0000 mL | Freq: Once | INTRAVENOUS | Status: AC
  Administered 2014-05-10: 1000 mL via INTRAVENOUS

## 2014-05-10 MED ORDER — MORPHINE SULFATE 2 MG/ML IJ SOLN
1.0000 mg | INTRAMUSCULAR | Status: DC | PRN
  Administered 2014-05-10 – 2014-05-11 (×6): 1 mg via INTRAVENOUS
  Filled 2014-05-10 (×6): qty 1

## 2014-05-10 MED ORDER — IBUPROFEN 600 MG PO TABS
600.0000 mg | ORAL_TABLET | Freq: Three times a day (TID) | ORAL | Status: DC | PRN
  Administered 2014-05-10: 600 mg via ORAL
  Filled 2014-05-10: qty 1

## 2014-05-10 MED ORDER — DEXTROSE 5 % IV SOLN
1.0000 g | INTRAVENOUS | Status: DC
  Administered 2014-05-11 – 2014-05-13 (×3): 1 g via INTRAVENOUS
  Filled 2014-05-10 (×3): qty 10

## 2014-05-10 MED ORDER — MEDROXYPROGESTERONE ACETATE 10 MG PO TABS: 10.0000 mg | ORAL_TABLET | Freq: Every day | ORAL | Status: DC

## 2014-05-10 MED ORDER — ACETAMINOPHEN 325 MG PO TABS
650.0000 mg | ORAL_TABLET | Freq: Four times a day (QID) | ORAL | Status: DC | PRN
  Administered 2014-05-11 – 2014-05-12 (×2): 650 mg via ORAL
  Filled 2014-05-10 (×3): qty 2

## 2014-05-10 MED ORDER — PNEUMOCOCCAL VAC POLYVALENT 25 MCG/0.5ML IJ INJ
0.5000 mL | INJECTION | INTRAMUSCULAR | Status: AC
  Administered 2014-05-12: 0.5 mL via INTRAMUSCULAR
  Filled 2014-05-10: qty 0.5

## 2014-05-10 MED ORDER — SENNOSIDES-DOCUSATE SODIUM 8.6-50 MG PO TABS
1.0000 | ORAL_TABLET | Freq: Every evening | ORAL | Status: DC | PRN
  Administered 2014-05-10: 1 via ORAL
  Filled 2014-05-10: qty 1

## 2014-05-10 MED ORDER — METOCLOPRAMIDE HCL 5 MG/ML IJ SOLN
10.0000 mg | Freq: Once | INTRAMUSCULAR | Status: AC
  Administered 2014-05-10: 10 mg via INTRAVENOUS
  Filled 2014-05-10: qty 2

## 2014-05-10 MED ORDER — POTASSIUM CHLORIDE CRYS ER 20 MEQ PO TBCR
40.0000 meq | EXTENDED_RELEASE_TABLET | Freq: Once | ORAL | Status: AC
  Administered 2014-05-10: 40 meq via ORAL
  Filled 2014-05-10: qty 2

## 2014-05-10 MED ORDER — SODIUM CHLORIDE 0.9 % IV SOLN
INTRAVENOUS | Status: DC
  Administered 2014-05-10 – 2014-05-12 (×5): via INTRAVENOUS

## 2014-05-10 MED ORDER — DEXTROSE 5 % IV SOLN
1.0000 g | Freq: Once | INTRAVENOUS | Status: AC
  Administered 2014-05-10: 1 g via INTRAVENOUS
  Filled 2014-05-10: qty 10

## 2014-05-10 MED ORDER — PROMETHAZINE HCL 12.5 MG PO TABS
12.5000 mg | ORAL_TABLET | Freq: Four times a day (QID) | ORAL | Status: DC | PRN
  Administered 2014-05-10 – 2014-05-11 (×3): 12.5 mg via ORAL
  Filled 2014-05-10 (×3): qty 1

## 2014-05-10 MED ORDER — ENSURE COMPLETE PO LIQD
237.0000 mL | Freq: Two times a day (BID) | ORAL | Status: DC
  Administered 2014-05-10 – 2014-05-13 (×4): 237 mL via ORAL

## 2014-05-10 MED ORDER — TRAZODONE HCL 50 MG PO TABS
50.0000 mg | ORAL_TABLET | Freq: Every evening | ORAL | Status: DC | PRN
  Administered 2014-05-10: 50 mg via ORAL
  Filled 2014-05-10: qty 1

## 2014-05-10 MED ORDER — KETOROLAC TROMETHAMINE 30 MG/ML IJ SOLN
30.0000 mg | Freq: Once | INTRAMUSCULAR | Status: AC
  Administered 2014-05-10: 30 mg via INTRAVENOUS
  Filled 2014-05-10: qty 1

## 2014-05-10 NOTE — ED Notes (Signed)
Patient c/o abd. Pain onset 1 week ago , c/o nausea vomiting and states she has been incont. Of stool, also states " she has been traveling in a car a lot recently". States she was running a fever this am and took a perocet. Also admits to eating a " pot brownie"

## 2014-05-10 NOTE — Progress Notes (Signed)
Chaplain followed up in response to consult placed this morning. PT states she is "just so tired," and said "maybe tomorrow" for a chaplain visit. Chaplain let her know our thoughts are with her and that we can try to check back in another time. Chaplain will refer to spiritual care team for follow-up.   Wille GlaserMcCray, Rikayla Demmon O, Chaplain 05/10/2014 1:33 PM
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
## 2014-05-10 NOTE — ED Notes (Signed)
Pt in Ultrasound
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
## 2014-05-10 NOTE — ED Notes (Signed)
Pt stated she is unable to urinate at this time. 

## 2014-05-10 NOTE — ED Notes (Signed)
Patient transported to Ultrasound 
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
## 2014-05-10 NOTE — Progress Notes (Signed)
Chaplain responded to referral. Upon entering chaplain noticed pt wrapped up in sheets. Chaplain introduced herself, but pt expressed she was "tired." Chaplain will come back at a later time.   05/10/14 0900  Clinical Encounter Type  Visited With Patient;Health care provider  Visit Type Initial  Referral From Other (Comment) (Admitting)  Stress Factors  Patient Stress Factors Exhausted  Imagene Boss, Mayer MaskerCourtney F, Chaplain 05/10/2014 9:36 AM
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
## 2014-05-10 NOTE — Progress Notes (Signed)
Attempted to do orthostatic vital signs several times throughout the day. Each time Pt asked for staff to return at a later time because she did not feel like standing up. Will attempt one more tome prior to shift change and if she continues to refuse I will notify the oncoming night shift RN.

## 2014-05-10 NOTE — ED Notes (Signed)
Pt reports she has been having fevers for a week but has not had anything to take her temp with. She also reports decreased appetite for a week and that she has lost 10 pounds, associated with nausea, vomiting and diarrhea, weakness, right and left abdominal pain as well right flank pain. Pt also reports legs are sore. Pts visitor reports pt has been disoriented and saying things that make no sense recently. Pt A&OX4 currently.

## 2014-05-10 NOTE — Progress Notes (Signed)
Orthostatic BP: Lying down: 117/59, sitting: 100/52, standing: 102/55.

## 2014-05-11 DIAGNOSIS — F1919 Other psychoactive substance abuse with unspecified psychoactive substance-induced disorder: Secondary | ICD-10-CM

## 2014-05-11 DIAGNOSIS — I959 Hypotension, unspecified: Secondary | ICD-10-CM

## 2014-05-11 LAB — URINE CULTURE: Colony Count: 3000

## 2014-05-11 LAB — BASIC METABOLIC PANEL
Anion gap: 10 (ref 5–15)
BUN: 8 mg/dL (ref 6–23)
CO2: 22 mEq/L (ref 19–32)
Calcium: 7.7 mg/dL — ABNORMAL LOW (ref 8.4–10.5)
Chloride: 106 mEq/L (ref 96–112)
Creatinine, Ser: 0.66 mg/dL (ref 0.50–1.10)
Glucose, Bld: 122 mg/dL — ABNORMAL HIGH (ref 70–99)
POTASSIUM: 4 meq/L (ref 3.7–5.3)
SODIUM: 138 meq/L (ref 137–147)

## 2014-05-11 LAB — CBC
HCT: 34.2 % — ABNORMAL LOW (ref 36.0–46.0)
Hemoglobin: 11.7 g/dL — ABNORMAL LOW (ref 12.0–15.0)
MCH: 30.9 pg (ref 26.0–34.0)
MCHC: 34.2 g/dL (ref 30.0–36.0)
MCV: 90.2 fL (ref 78.0–100.0)
PLATELETS: 221 10*3/uL (ref 150–400)
RBC: 3.79 MIL/uL — ABNORMAL LOW (ref 3.87–5.11)
RDW: 13.5 % (ref 11.5–15.5)
WBC: 13.3 10*3/uL — AB (ref 4.0–10.5)

## 2014-05-11 LAB — GC/CHLAMYDIA PROBE AMP
CT Probe RNA: NEGATIVE
GC PROBE AMP APTIMA: NEGATIVE

## 2014-05-11 LAB — RPR

## 2014-05-11 MED ORDER — MORPHINE SULFATE 2 MG/ML IJ SOLN
2.0000 mg | INTRAMUSCULAR | Status: DC | PRN
  Administered 2014-05-11 – 2014-05-13 (×10): 2 mg via INTRAVENOUS
  Filled 2014-05-11 (×10): qty 1

## 2014-05-11 MED ORDER — IBUPROFEN 600 MG PO TABS
600.0000 mg | ORAL_TABLET | ORAL | Status: DC | PRN
  Filled 2014-05-11: qty 1

## 2014-05-11 MED ORDER — SODIUM CHLORIDE 0.9 % IV BOLUS (SEPSIS)
1000.0000 mL | Freq: Once | INTRAVENOUS | Status: AC
  Administered 2014-05-11: 1000 mL via INTRAVENOUS

## 2014-05-11 MED ORDER — INFLUENZA VAC SPLIT QUAD 0.5 ML IM SUSY
0.5000 mL | PREFILLED_SYRINGE | INTRAMUSCULAR | Status: DC
  Filled 2014-05-11: qty 0.5
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
## 2014-05-11 NOTE — Progress Notes (Signed)
On call notified.  New orders for Motrin and stool softener received.  Last temp was 100.5.  Will continue to monitor.  Patient currently resting at this time.  Fiancee at the bedside.
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
## 2014-05-11 NOTE — Progress Notes (Signed)
Air mattress bed placed in patients room by RN and NT.

## 2014-05-11 NOTE — Progress Notes (Signed)
Patient's fiancee is anxious yet again now with regards to patient's low BP.  He is requesting the doctor to visit with patient this am.  Her last vitals at 0613 T 97.4, HR 48, R 16, BP 95/50, O2 92% ra, pain rate 3.  Patient is alert but trying to sleep.  She denies any discomfort at this time.  Paged on call.  Received new order for bolus.  Team will be rounding this am to see patient.
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
## 2014-05-12 DIAGNOSIS — F419 Anxiety disorder, unspecified: Secondary | ICD-10-CM

## 2014-05-12 LAB — CBC
HCT: 32 % — ABNORMAL LOW (ref 36.0–46.0)
HEMOGLOBIN: 11.1 g/dL — AB (ref 12.0–15.0)
MCH: 31 pg (ref 26.0–34.0)
MCHC: 34.7 g/dL (ref 30.0–36.0)
MCV: 89.4 fL (ref 78.0–100.0)
Platelets: 271 10*3/uL (ref 150–400)
RBC: 3.58 MIL/uL — AB (ref 3.87–5.11)
RDW: 13.5 % (ref 11.5–15.5)
WBC: 10.7 10*3/uL — ABNORMAL HIGH (ref 4.0–10.5)

## 2014-05-12 LAB — BASIC METABOLIC PANEL
ANION GAP: 10 (ref 5–15)
BUN: 4 mg/dL — ABNORMAL LOW (ref 6–23)
CALCIUM: 7.8 mg/dL — AB (ref 8.4–10.5)
CHLORIDE: 103 meq/L (ref 96–112)
CO2: 24 mEq/L (ref 19–32)
Creatinine, Ser: 0.64 mg/dL (ref 0.50–1.10)
GFR calc Af Amer: >90 mL/min (ref >90)
GFR calc non Af Amer: >90 mL/min (ref >90)
Glucose, Bld: 94 mg/dL (ref 70–99)
Potassium: 4.1 mEq/L (ref 3.7–5.3)
SODIUM: 137 meq/L (ref 137–147)

## 2014-05-12 MED ORDER — HYDROXYZINE HCL 25 MG PO TABS
25.0000 mg | ORAL_TABLET | Freq: Four times a day (QID) | ORAL | Status: DC | PRN
  Administered 2014-05-12: 25 mg via ORAL
  Filled 2014-05-12: qty 1

## 2014-05-12 MED ORDER — IBUPROFEN 600 MG PO TABS
600.0000 mg | ORAL_TABLET | Freq: Four times a day (QID) | ORAL | Status: DC | PRN
  Administered 2014-05-12: 600 mg via ORAL
  Filled 2014-05-12 (×2): qty 1
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
## 2014-05-12 NOTE — Progress Notes (Signed)
I followed up with patient per request of friend Christy Osborne.  Patient has been put on contact precautions.  Friend Christy Osborne is very anxious about whether he should see a doctor since he has had contact with patient before the precautions were put into effect.  He is also anxious because he doesn't know if patient wants him to spend the night.  ([place] in [place]  I visited with the paitent alone and she requested a Catholic priest because she wants to do a confession.  She stated she was very tired. She doesn't know how to advise Mr. Christy Osborne about whether he should stay or go because she "can't even make decisions for herself".  She said she is comfortable with him being present but worries that she needs to entertain him/talk to him while he's here and she feels to tired for that.  I informed patient that we would put in a request for a Catholic priest. I informed friend that a new chaplain would be coming on and that I would pass along patient's request. I referred case to incoming Christy ClicheChaplain Christy Osborne and communicated the request for Catholic priest to visit.    Chaplain Christy Osborne [phone]

## 2014-05-13 MED ORDER — ENSURE COMPLETE PO LIQD: 237.0000 mL | Freq: Two times a day (BID) | ORAL | Status: AC

## 2014-05-13 MED ORDER — PANTOPRAZOLE SODIUM 40 MG PO TBEC: 40.0000 mg | DELAYED_RELEASE_TABLET | Freq: Every day | ORAL | Status: AC

## 2014-05-13 MED ORDER — TRAZODONE HCL 50 MG PO TABS: 50.0000 mg | ORAL_TABLET | Freq: Every evening | ORAL | Status: AC | PRN

## 2014-05-13 MED ORDER — CIPROFLOXACIN HCL 500 MG PO TABS: 500.0000 mg | ORAL_TABLET | Freq: Two times a day (BID) | ORAL | Status: AC
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
## 2014-05-13 NOTE — Progress Notes (Signed)
Medicine attending discharge note: I personally interviewed and examined this patient today together with resident physician Dr. Elyn Peersasrif on med and I concur with his evaluation and discharge plan.  Clinical summary: 43 year old woman who presented to the hospital on October 2 with a one-week history of lower abdominal pain, three-day history of right flank pain, nausea, vomiting, loose bowel movements, and urinary frequency. Initial blood pressure 95/56. Pulse 55. Temperature 97.8.  Respirations 16.Within 12 hours of admission she had a fever of 103.1. Initial white blood count elevated at 16,900. Urinalysis showed pyuria and bacteriuria. There was mild tenderness in the lower quadrants on exam of the abdomen. She refused a pelvic exam. Abdominal ultrasound showed no focal abnormalities. Ovaries not visible. No free fluid. Transvaginal ultrasound refused by the patient.  Findings consistent with pyelonephritis. She was started on antibiotics with ceftriaxone. She had a rapid response with resolution of fever. On my examination today, there is no flank tenderness. There is focal tenderness in the epigastric region with minimal tenderness in the right upper quadrant and right lower quadrant. White blood count down to 10,700.  I feel she is stable enough to transition to oral antibiotics to finish a ten-day course. She would like to followup with women's health for her pelvic exams.  Impression: #1. Pyelonephritis responding to treatment; pelvic inflammatory disease not excluded (patient refused exam). #2. Gastritis #3. History of uterine adenomyosis with dysfunctional uterine bleeding #4. Hyponatremia-resolved with fluid and electrolyte replacement  Cephas DarbyJames Granfortuna, MD, FACP  Hematology-Oncology/Internal Medicine

## 2014-05-13 NOTE — Discharge Instructions (Signed)
It was a pleasure taking care of you. You were admitted with kidney infection. We have treated you with antibiotics here in the hospital. You are doing better now. You should continue to take ciprofloxacin antibiotic 500mg  twice daily for 7 days. Follow up with wellness center.  Pyelonephritis, Adult Pyelonephritis is a kidney infection. In general, there are 2 main types of pyelonephritis:  Infections that come on quickly without any warning (acute pyelonephritis).  Infections that persist for a long period of time (chronic pyelonephritis). CAUSES  Two main causes of pyelonephritis are:  Bacteria traveling from the bladder to the kidney. This is a problem especially in pregnant women. The urine in the bladder can become filled with bacteria from multiple causes, including:  Inflammation of the prostate gland (prostatitis).  Sexual intercourse in females.  Bladder infection (cystitis).  Bacteria traveling from the bloodstream to the tissue part of the kidney. Problems that may increase your risk of getting a kidney infection include:  Diabetes.  Kidney stones or bladder stones.  Cancer.  Catheters placed in the bladder.  Other abnormalities of the kidney or ureter. SYMPTOMS   Abdominal pain.  Pain in the side or flank area.  Fever.  Chills.  Upset stomach.  Blood in the urine (dark urine).  Frequent urination.  Strong or persistent urge to urinate.  Burning or stinging when urinating. DIAGNOSIS  Your caregiver may diagnose your kidney infection based on your symptoms. A urine sample may also be taken. TREATMENT  In general, treatment depends on how severe the infection is.   If the infection is mild and caught early, your caregiver may treat you with oral antibiotics and send you home.  If the infection is more severe, the bacteria may have gotten into the bloodstream. This will require intravenous (IV) antibiotics and a hospital stay. Symptoms may  include:  High fever.  Severe flank pain.  Shaking chills.  Even after a hospital stay, your caregiver may require you to be on oral antibiotics for a period of time.  Other treatments may be required depending upon the cause of the infection. HOME CARE INSTRUCTIONS   Take your antibiotics as directed. Finish them even if you start to feel better.  Make an appointment to have your urine checked to make sure the infection is gone.  Drink enough fluids to keep your urine clear or pale yellow.  Take medicines for the bladder if you have urgency and frequency of urination as directed by your caregiver. SEEK IMMEDIATE MEDICAL CARE IF:   You have a fever or persistent symptoms for more than 2-3 days.  You have a fever and your symptoms suddenly get worse.  You are unable to take your antibiotics or fluids.  You develop shaking chills.  You experience extreme weakness or fainting.  There is no improvement after 2 days of treatment. MAKE SURE YOU:  Understand these instructions.  Will watch your condition.  Will get help right away if you are not doing well or get worse. Document Released: 07/26/2005 Document Revised: 01/25/2012 Document Reviewed: 12/30/2010 [facility] Patient Information 2015 [employer] [place]. This information is not intended to replace advice given to you by your health care provider. Make sure you discuss any questions you have with your health care provider.

## 2014-05-15 ENCOUNTER — Telehealth: Payer: Self-pay | Admitting: General Practice

## 2014-05-15 NOTE — Telephone Encounter (Signed)
Pt. Called stating that she is still having pain after discharging from the hospital and would like stronger pain meds, pt. Was told that our doctors would not be able to prescribe any medication because she has not established care with our facility.

## 2014-05-20 ENCOUNTER — Inpatient Hospital Stay: Payer: BC Managed Care – PPO | Admitting: Internal Medicine

## 2014-06-10 ENCOUNTER — Encounter (HOSPITAL_COMMUNITY): Payer: Self-pay | Admitting: Emergency Medicine

## 2016-01-25 IMAGING — US US PELVIS COMPLETE
1 series · 14 of 14 positions shown · non-contrast
Comparison: 08/27/2011.

CLINICAL DATA: Abdominal pain.  Pelvic pain.

EXAM:
TRANSABDOMINAL ULTRASOUND OF PELVIS
TECHNIQUE: Transabdominal ultrasound examination of the pelvis was performed
including evaluation of the uterus, ovaries, adnexal regions, and
pelvic cul-de-sac.

[Series 1: us pelvis complete · 0.24mm/px · 14 of 14 slices shown]
[im 1/14]
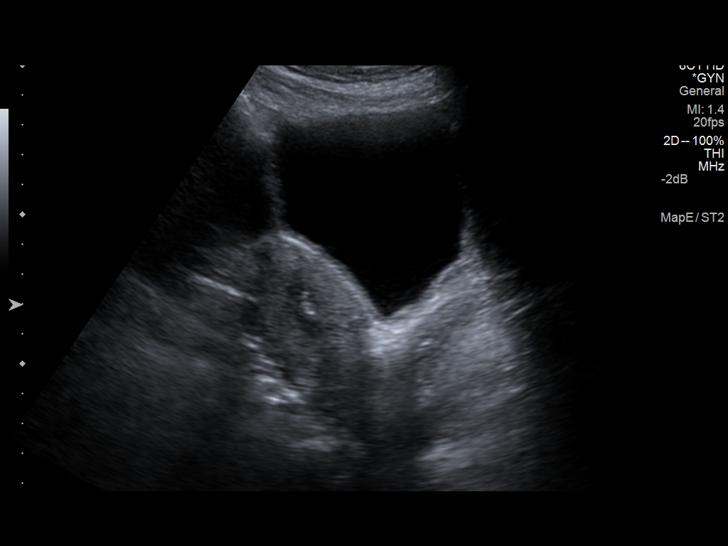
[im 2/14]
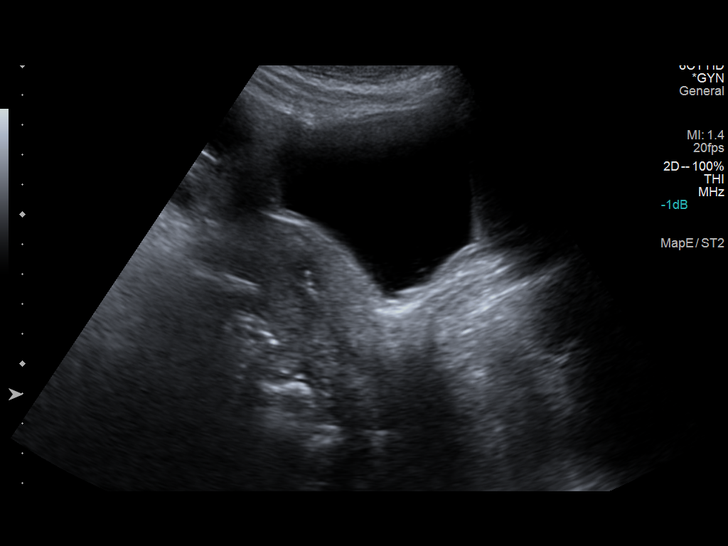
[im 3/14]
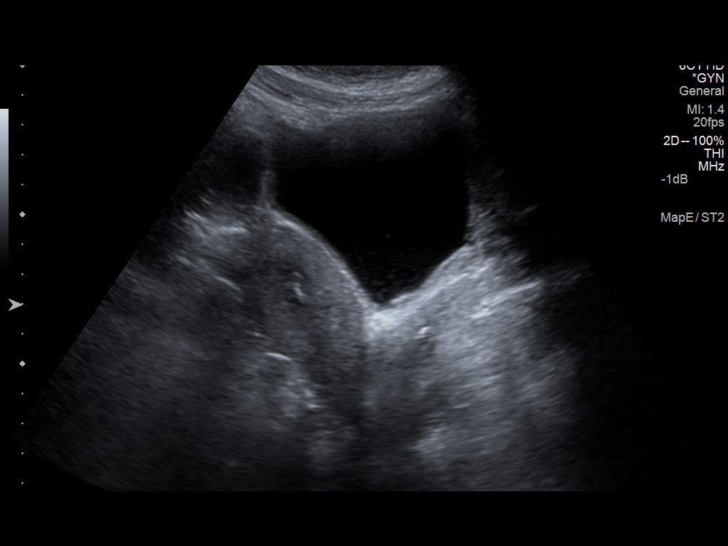
[im 4/14]
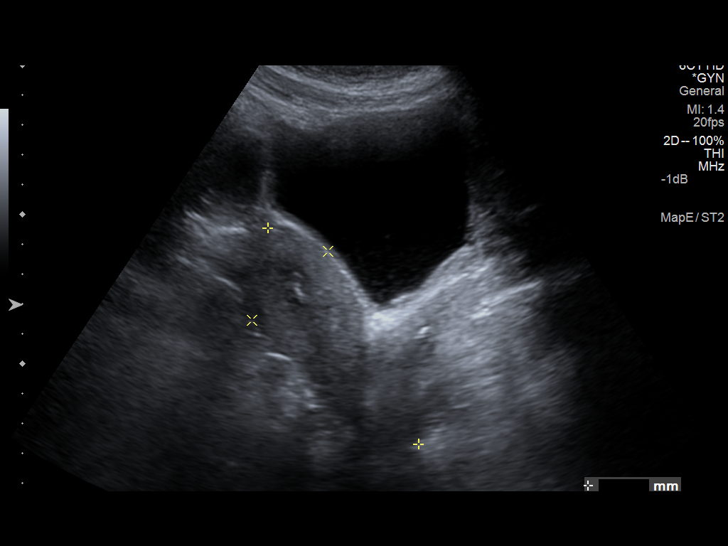
[im 5/14]
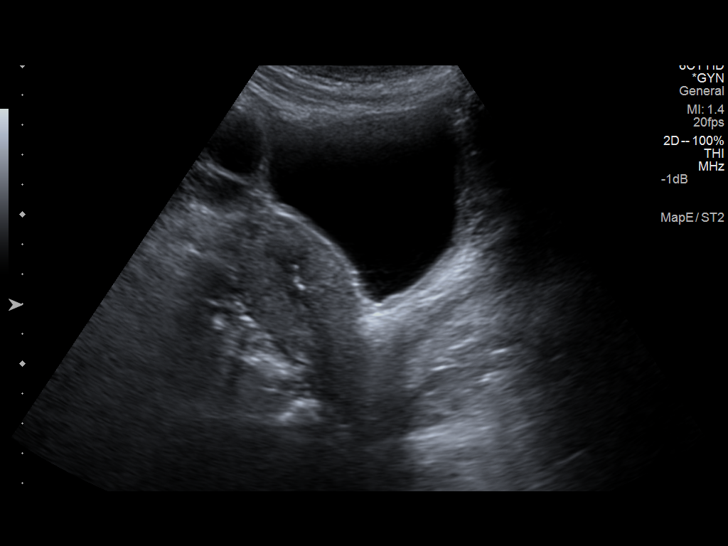
[im 6/14]
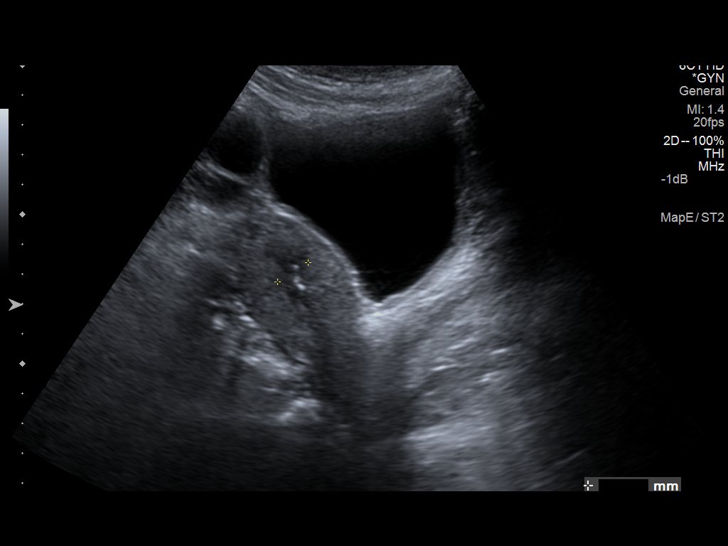
[im 7/14]
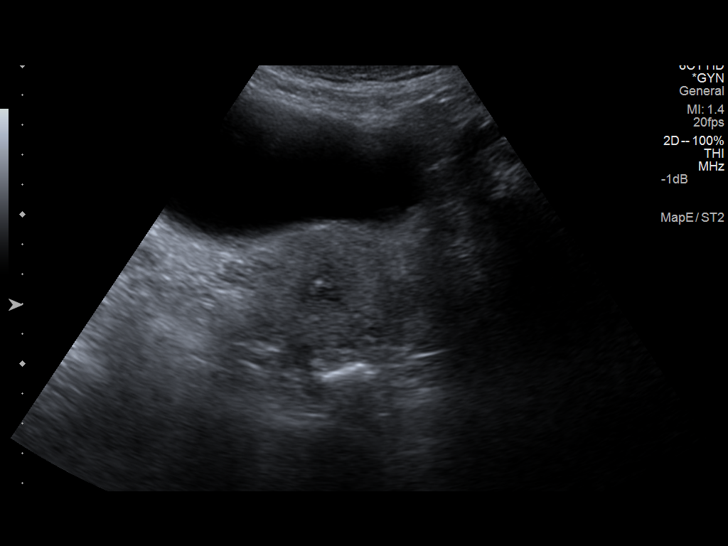
[im 8/14]
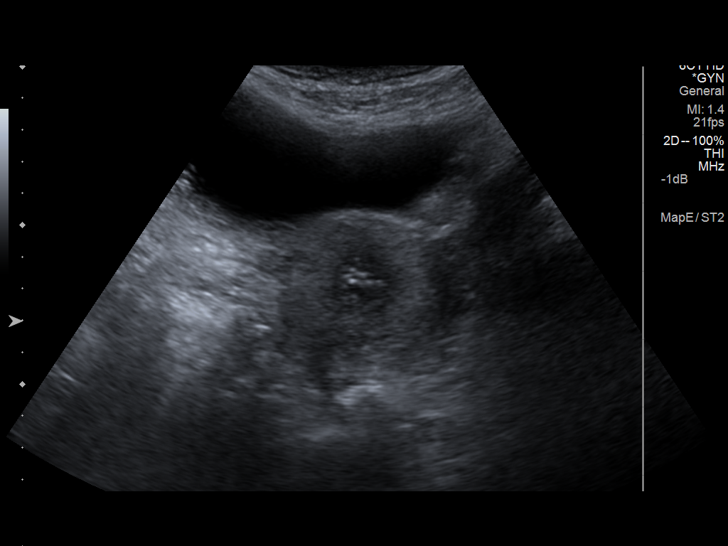
[im 9/14]
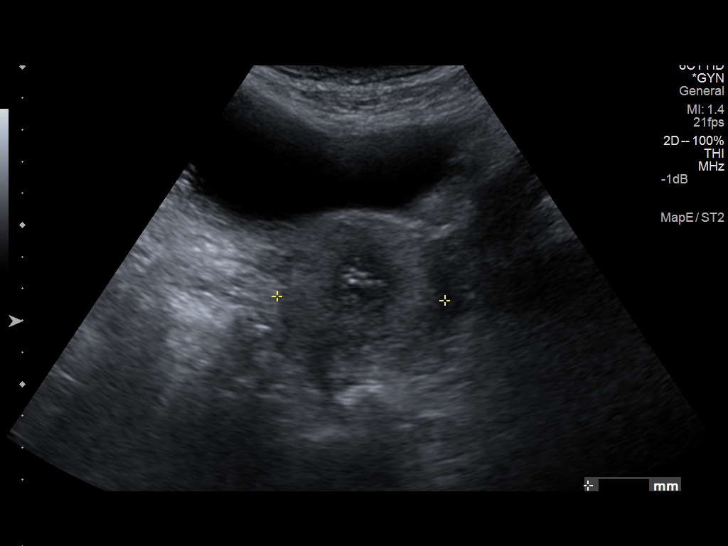
[im 10/14]
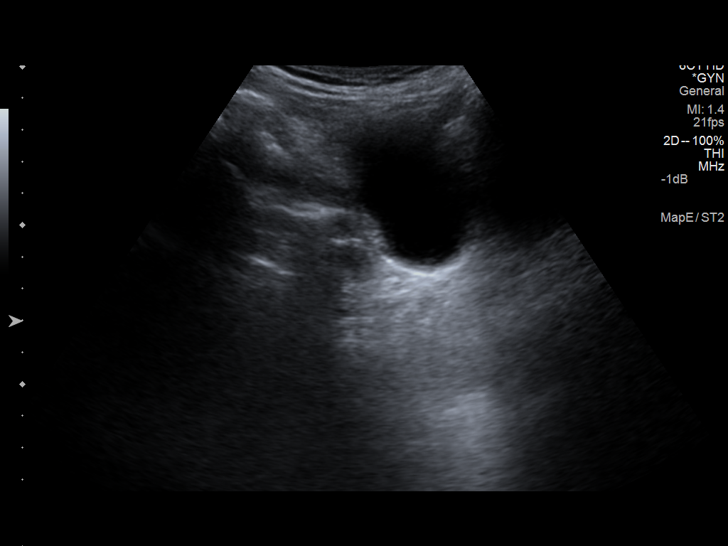
[im 11/14]
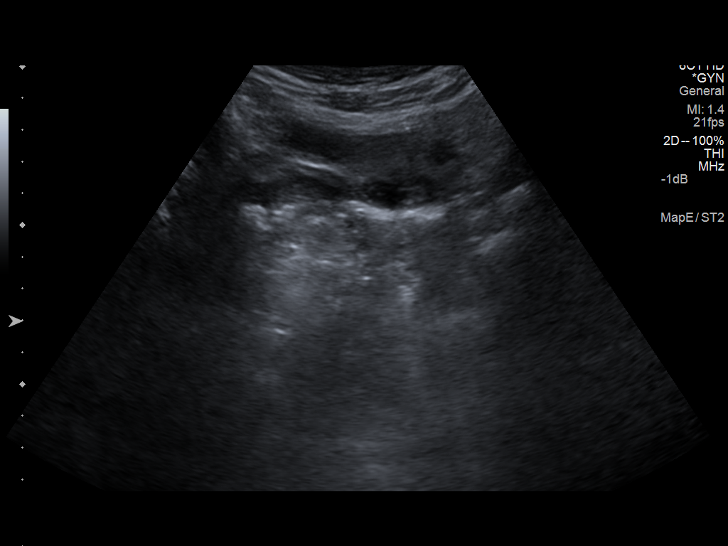
[im 12/14]
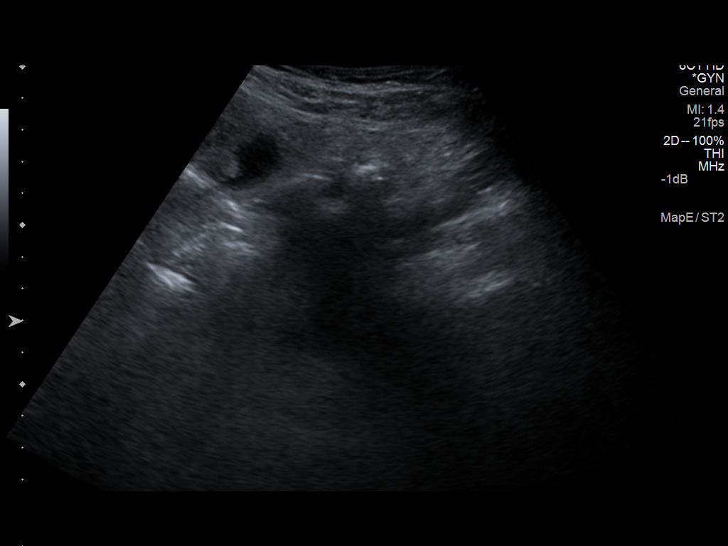
[im 13/14]
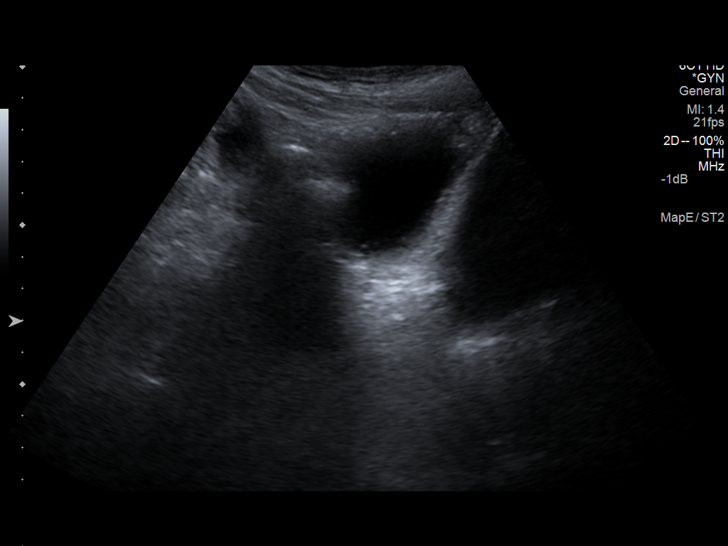
[im 14/14]
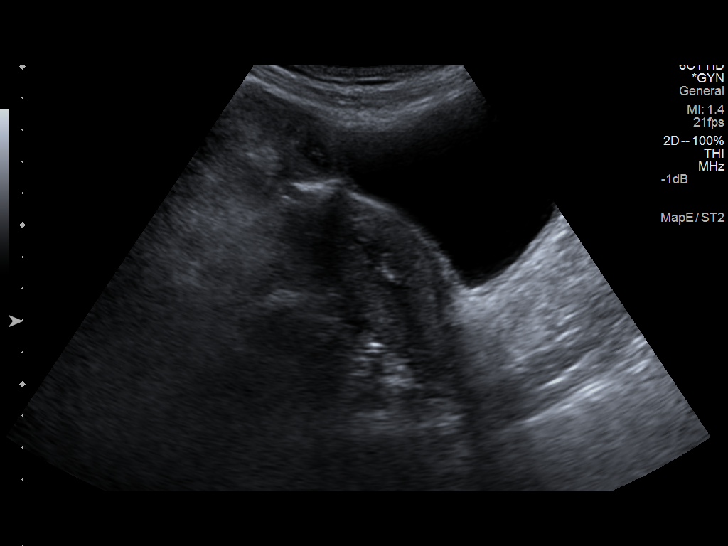

[14 of 14 positions shown; findings below may reference images not displayed]

FINDINGS: Uterus

Measurements: 88 mm x 34 mm x 53 mm. No fibroids or other mass
visualized.

Endometrium

Thickness: 12 mm.  No focal abnormality visualized.

Right ovary

Not visible.

Left ovary

Not visible.

Other findings:  No free fluid.

Patient refused transvaginal examination.
IMPRESSION: Normal transabdominal pelvic ultrasound.
